# Patient Record
Sex: Female | Born: 1960 | Race: White | Hispanic: No | Marital: Married | Smoking: Never smoker
Health system: Southern US, Community
[De-identification: ages and names within clinical notes are randomized; demographics above are authoritative.]

## PROBLEM LIST (undated history)

## (undated) DIAGNOSIS — E039 Hypothyroidism, unspecified: Secondary | ICD-10-CM

## (undated) DIAGNOSIS — F419 Anxiety disorder, unspecified: Secondary | ICD-10-CM

## (undated) HISTORY — DX: Anxiety disorder, unspecified: F41.9

## (undated) HISTORY — DX: Hypothyroidism, unspecified: E03.9

---

## 2000-07-19 ENCOUNTER — Other Ambulatory Visit: Admission: RE | Admit: 2000-07-19 | Discharge: 2000-07-19 | Payer: Self-pay | Admitting: Obstetrics and Gynecology

## 2000-08-24 ENCOUNTER — Encounter (INDEPENDENT_AMBULATORY_CARE_PROVIDER_SITE_OTHER): Payer: Self-pay

## 2000-08-24 ENCOUNTER — Other Ambulatory Visit: Admission: RE | Admit: 2000-08-24 | Discharge: 2000-08-24 | Payer: Self-pay | Admitting: Obstetrics and Gynecology

## 2001-06-27 ENCOUNTER — Other Ambulatory Visit: Admission: RE | Admit: 2001-06-27 | Discharge: 2001-06-27 | Payer: Self-pay | Admitting: Obstetrics and Gynecology

## 2002-04-27 HISTORY — PX: ABDOMINAL HYSTERECTOMY: SHX81

## 2002-06-29 ENCOUNTER — Other Ambulatory Visit: Admission: RE | Admit: 2002-06-29 | Discharge: 2002-06-29 | Payer: Self-pay | Admitting: Obstetrics and Gynecology

## 2002-07-07 ENCOUNTER — Encounter: Admission: RE | Admit: 2002-07-07 | Discharge: 2002-07-07 | Payer: Self-pay | Admitting: Internal Medicine

## 2002-07-07 ENCOUNTER — Encounter: Payer: Self-pay | Admitting: Internal Medicine

## 2002-10-10 ENCOUNTER — Inpatient Hospital Stay (HOSPITAL_COMMUNITY): Admission: RE | Admit: 2002-10-10 | Discharge: 2002-10-12 | Payer: Self-pay | Admitting: Obstetrics and Gynecology

## 2002-10-10 ENCOUNTER — Encounter (INDEPENDENT_AMBULATORY_CARE_PROVIDER_SITE_OTHER): Payer: Self-pay | Admitting: *Deleted

## 2009-10-23 ENCOUNTER — Encounter: Admission: RE | Admit: 2009-10-23 | Discharge: 2009-10-23 | Payer: Self-pay | Admitting: Internal Medicine

## 2009-10-25 ENCOUNTER — Encounter: Admission: RE | Admit: 2009-10-25 | Discharge: 2009-10-25 | Payer: Self-pay | Admitting: Internal Medicine

## 2009-11-25 HISTORY — PX: CHOLECYSTECTOMY: SHX55

## 2009-12-11 ENCOUNTER — Encounter (INDEPENDENT_AMBULATORY_CARE_PROVIDER_SITE_OTHER): Payer: Self-pay | Admitting: *Deleted

## 2009-12-11 ENCOUNTER — Ambulatory Visit (HOSPITAL_COMMUNITY): Admission: RE | Admit: 2009-12-11 | Discharge: 2009-12-11 | Payer: Self-pay | Admitting: Gastroenterology

## 2009-12-20 ENCOUNTER — Encounter: Admission: RE | Admit: 2009-12-20 | Discharge: 2009-12-20 | Payer: Self-pay | Admitting: General Surgery

## 2010-01-30 ENCOUNTER — Encounter (INDEPENDENT_AMBULATORY_CARE_PROVIDER_SITE_OTHER): Payer: Self-pay | Admitting: *Deleted

## 2010-02-28 ENCOUNTER — Encounter (INDEPENDENT_AMBULATORY_CARE_PROVIDER_SITE_OTHER): Payer: Self-pay | Admitting: *Deleted

## 2010-02-28 ENCOUNTER — Ambulatory Visit: Payer: Self-pay | Admitting: Gastroenterology

## 2010-02-28 DIAGNOSIS — R109 Unspecified abdominal pain: Secondary | ICD-10-CM | POA: Insufficient documentation

## 2010-04-29 ENCOUNTER — Ambulatory Visit
Admission: RE | Admit: 2010-04-29 | Discharge: 2010-04-29 | Payer: Self-pay | Source: Home / Self Care | Attending: Gastroenterology | Admitting: Gastroenterology

## 2010-04-29 ENCOUNTER — Encounter: Payer: Self-pay | Admitting: Gastroenterology

## 2010-05-02 ENCOUNTER — Encounter: Payer: Self-pay | Admitting: Gastroenterology

## 2010-05-27 NOTE — Letter (Signed)
Summary: [facility] Instructions  [facility]  [address] [place], [place] [address]   Phone: [phone]  Fax: [phone]       Caroline Huynh    09-13-1960    MRN: [id number]        Procedure Day /Date:04/29/10 TUE     Arrival Time:1230 pm     Procedure Time:130 pm     Location of Procedure:                    X  [facility] (4th Floor)                        PREPARATION FOR COLONOSCOPY WITH MOVIPREP   Starting 5 days prior to your procedure 04/24/10  do not eat nuts, seeds, popcorn, corn, beans, peas,  salads, or any raw vegetables.  Do not take any fiber supplements (e.g. Metamucil, Citrucel, and Benefiber).  THE DAY BEFORE YOUR PROCEDURE         DATE: 04/28/10   DAY:MON  1.  Drink clear liquids the entire day-NO SOLID FOOD  2.  Do not drink anything colored red or purple.  Avoid juices with pulp.  No orange juice.  3.  Drink at least 64 oz. (8 glasses) of fluid/clear liquids during the day to prevent dehydration and help the prep work efficiently.  CLEAR LIQUIDS INCLUDE: Water Jello Ice Popsicles Tea (sugar ok, no milk/cream) Powdered fruit flavored drinks Coffee (sugar ok, no milk/cream) Gatorade Juice: apple, white grape, white cranberry  Lemonade Clear bullion, consomm, broth Carbonated beverages (any kind) Strained chicken noodle soup Hard Candy                             4.  In the morning, mix first dose of MoviPrep solution:    Empty 1 Pouch A and 1 Pouch B into the disposable container    Add lukewarm drinking water to the top line of the container. Mix to dissolve    Refrigerate (mixed solution should be used within 24 hrs)  5.  Begin drinking the prep at 5:00 p.m. The MoviPrep container is divided by 4 marks.   Every 15 minutes drink the solution down to the next mark (approximately 8 oz) until the full liter is complete.   6.  Follow completed prep with 16 oz of clear liquid of your choice (Nothing red or purple).   Continue to drink clear liquids until bedtime.  7.  Before going to bed, mix second dose of MoviPrep solution:    Empty 1 Pouch A and 1 Pouch B into the disposable container    Add lukewarm drinking water to the top line of the container. Mix to dissolve    Refrigerate  THE DAY OF YOUR PROCEDURE      DATE: 04/29/10 DAY: TUE  Beginning at 830 a.m. (5 hours before procedure):         1. Every 15 minutes, drink the solution down to the next mark (approx 8 oz) until the full liter is complete.  2. Follow completed prep with 16 oz. of clear liquid of your choice.    3. You may drink clear liquids until 1130 am (2 HOURS BEFORE PROCEDURE).   MEDICATION INSTRUCTIONS  Unless otherwise instructed, you should take regular prescription medications with a small sip of water   as early as possible the morning of your procedure.  OTHER INSTRUCTIONS  You will need a responsible adult at least 51 years of age to accompany you and drive you home.   This person must remain in the waiting room during your procedure.  Wear loose fitting clothing that is easily removed.  Leave jewelry and other valuables at home.  However, you may wish to bring a book to read or  an iPod/MP3 player to listen to music as you wait for your procedure to start.  Remove all body piercing jewelry and leave at home.  Total time from sign-in until discharge is approximately 2-3 hours.  You should go home directly after your procedure and rest.  You can resume normal activities the  day after your procedure.  The day of your procedure you should not:   Drive   Make legal decisions   Operate machinery   Drink alcohol   Return to work  You will receive specific instructions about eating, activities and medications before you leave.    The above instructions have been reviewed and explained to me by   _______________________    I fully understand and can verbalize these instructions  _____________________________ Date _________

## 2010-05-27 NOTE — Letter (Signed)
Summary: New Patient letter  [facility]  [address] [place], [place] [address]   Phone: [phone]  Fax: [phone]       01/30/2010 MRN: [id number]  Caroline Huynh [address] [place], [place]  [address]  Dear Ms. Caroline Huynh,  Welcome to the Gastroenterology Division at [facility].    You are scheduled to see Dr. Christella Hartigan on 02/28/10 at 10:15 am on the 3rd floor at [facility], [address]  We ask that you try to arrive at our office 15 minutes prior to your appointment time to allow for check-in.  We would like you to complete the enclosed self-administered evaluation form prior to your visit and bring it with you on the day of your appointment.  We will review it with you.  Also, please bring a complete list of all your medications or, if you prefer, bring the medication bottles and we will list them.  Please bring your insurance card so that we may make a copy of it.  If your insurance requires a referral to see a specialist, please bring your referral form from your primary care physician.  Co-payments are due at the time of your visit and may be paid by cash, check or credit card.     Your office visit will consist of a consult with your physician (includes a physical exam), any laboratory testing he/she may order, scheduling of any necessary diagnostic testing (e.g. x-ray, ultrasound, CT-scan), and scheduling of a procedure (e.g. Endoscopy, Colonoscopy) if required.  Please allow enough time on your schedule to allow for any/all of these possibilities.    If you cannot keep your appointment, please call [phone] to cancel or reschedule prior to your appointment date.  This allows [place] the opportunity to schedule an appointment for another patient in need of care.  If you do not cancel or reschedule by 5 p.m. the business day prior to your appointment date, you will be charged a $50.00 late cancellation/no-show fee.    Thank you for choosing  [facility] for your medical needs.  We appreciate the opportunity to care for you.  Please visit [place] at our website  to learn more about our practice.                     Sincerely,                                                             The Gastroenterology Division

## 2010-05-27 NOTE — Assessment & Plan Note (Signed)
History of Present Illness Visit Type: Initial Consult Primary GI MD: Rob Bunting MD Primary Provider: Joylene Draft Avva,MD Requesting Provider: Joylene Draft Avva,MD Chief Complaint: Abdominal Pain History of Present Illness:       pleasant 50 year old womanwho had lap chole about 2 months ago for GB EF of 9%.  She was having intermittent (2-3 years) of left flank "tightness" every few months. Then she started having RUQ discomfort intermittne that radiates to her back.  She also has a third pain in epigastricum that is improved with PPI (dexilant). Stretching can cause symptoms to worsen.    Eating does not effect any of the symptoms.  She thinks there are times when a BM will cause some tightness in her left side shortly afterwards.   She has no classic GERD symptoms.  Overall stable weight.  No overt GI bleeding.    She has been seing Dr. Ewing Schlein.  Not happy with him, wanted  second opinion.  She had a colonoscopy 4 years ago (poor prep). This showed internal and external hemorrhoids but was otherwise normal, stool was found in the entire colon and his "interfered with visualization"  Cholecystectomy did not improve any of her symptoms at all.  She has alternatiing constipation/loose stools.  Tried miralax for many years, changed to benefiber powder...not much improvement.  in the past few months she has had a variety of imaging tests. A CT scan done in July 2011 showed diverticulosis without diverticulitis. It was otherwise essentially normal. An abdominal ultrasound June 2011 was completely normal. A HIDA scan was document above with a low gallbladder ejection fraction. She has never had upper endoscopy.  basic set of labs including a CBC, complete metabolic profile were all normal recently.           Current Medications (verified): 1)  Benefiber  Powd (Wheat Dextrin) .... 2 Teaspoons Daily 2)  Vitamin D (Ergocalciferol) 50000 Unit Caps (Ergocalciferol) .... Weekly 3)   Synthroid 75 Mcg Tabs (Levothyroxine Sodium) .Marland Kitchen.. 1 By Mouth Once Daily 4)  Xyzal 5 Mg Tabs (Levocetirizine Dihydrochloride) .Marland Kitchen.. 1 By Mouth Once Daily 5)  Dexilant 60 Mg Cpdr (Dexlansoprazole) .... As Needed 6)  Estring 2 Mg Ring (Estradiol) .... Every 3 Months 7)  Clonazepam 0.5 Mg Tbdp (Clonazepam) .... 1/2 By Mouth Once Daily As Needed 8)  Hyoscyamine Sulfate Cr 0.375 Mg Xr12h-Tab (Hyoscyamine Sulfate) .Marland Kitchen.. 1 By Mouth Once Daily 9)  Vivelle-Dot 0.05 Mg/24hr Pttw (Estradiol) .... 2 Times Per Week As Needed 10)  Vitamin C 500 Mg Tabs (Ascorbic Acid) .Marland Kitchen.. 1 By Mouth Once Daily 11)  Centrum Silver  Tabs (Multiple Vitamins-Minerals) .Marland Kitchen.. 1 By Mouth Once Daily 12)  Triple Flex 500-400-125 Mg Tabs (Glucosamine-Chondroitin-Msm) .... Once Daily 13)  Fish Oil  Oil (Fish Oil) .Marland Kitchen.. 1200 Mg Once Daily  Allergies (verified): No Known Drug Allergies  Past History:  Past Medical History: Hyperlipidemia Vit D def GERD Hypothyroidism      Past Surgical History: laparoscopic cholecystectomy August 2011  Family History: heart disease No colon cancer  Social History: she is married, she has one son, she works as a [employer] Services operational officer, she does not smoke cigarettes or drink alcohol.  Review of Systems       Pertinent positive and negative review of systems were noted in the above HPI and GI specific review of systems.  All other review of systems was otherwise negative.   Vital Signs:  Patient profile:   50 year old female Height:      27  inches Weight:      157 pounds BMI:     26.22 BSA:     1.79 Pulse rate:   76 / minute Pulse rhythm:   regular BP sitting:   104 / 80  (left arm)  Vitals Entered By: Merri Ray CMA Duncan Dull) (February 28, 2010 10:05 AM)  Physical Exam  Additional Exam:  Constitutional: generally well appearing Psychiatric: alert and oriented times 3 Eyes: extraocular movements intact Mouth: oropharynx moist, no lesions Neck: supple, no lymphadenopathy Cardiovascular:  heart regular rate and rythm Lungs: CTA bilaterally Abdomen: soft, non-tender, non-distended, no obvious ascites, no peritoneal signs, normal bowel sounds Extremities: no lower extremity edema bilaterally Skin: no lesions on visible extremities    Impression & Recommendations:  Problem # 1:  multiple sites of abdominal discomfort the left flank pain seems unlikely to be GI related givin its location and the fact that body positions can make it better or worse. This seems more likely musculoskeletal. Her to upper abdominal pains are of unclear etiology, possibly functional dyspepsia. She had a poorly prepped colonoscopy 4 years ago and I think we should repeat that now given the poor prep and her symptoms. We should also at the same time proceed with an EGD. She did tell me today that some benzodiazepines that her gynecologist has prescribed for have been helping some of her abdominal discomforts and she is beginning to think that anxiety, stress may be related to her symptoms.  Patient Instructions: 1)  You will be scheduled to have a colonoscopy. 2)  You will be scheduled to have an upper endoscopy. 3)  A copy of this information will be sent to Dr. Felipa Eth. 4)  The medication list was reviewed and reconciled.  All changed / newly prescribed medications were explained.  A complete medication list was provided to the patient / caregiver.  Appended Document: Orders Update/movi    Clinical Lists Changes  Problems: Added new problem of ABDOMINAL PAIN OTHER SPECIFIED SITE (ICD-789.09) Medications: Added new medication of MOVIPREP 100 GM  SOLR (PEG-KCL-NACL-NASULF-NA ASC-C) As per prep instructions. - Signed Rx of MOVIPREP 100 GM  SOLR (PEG-KCL-NACL-NASULF-NA ASC-C) As per prep instructions.;  #1 x 0;  Signed;  Entered by: Chales Abrahams CMA (AAMA);  Authorized by: Rachael Fee MD;  Method used: Electronically to [facility] [address] # [id number]*, [address] [place], [place],  [place]  [address], Ph: [phone] or [phone], Fax: [phone] Orders: Added new Test order of Colon/Endo (Colon/Endo) - Signed    Prescriptions: MOVIPREP 100 GM  SOLR (PEG-KCL-NACL-NASULF-NA ASC-C) As per prep instructions.  #1 x 0   Entered by:   Chales Abrahams CMA (AAMA)   Authorized by:   Rachael Fee MD   Signed by:   Chales Abrahams CMA (AAMA) on 02/28/2010   Method used:   Electronically to        [facility] [address] [id number]* (retail)       [address]       [place], [place]  [address]       Ph: [phone] or [phone]       Fax: [phone]   RxID:   [id number]

## 2010-05-29 NOTE — Procedures (Signed)
Summary: Colonoscopy  Patient: Caroline Huynh Note: All result statuses are Final unless otherwise noted.  Tests: (1) Colonoscopy (COL)   COL Colonoscopy           DONE     [facility]     [address]     , [place]  [address]           COLONOSCOPY PROCEDURE REPORT           PATIENT:  [name], [name]  MR#:  [id number]     BIRTHDATE:  27-Oct-1960, 49 yrs. old  GENDER:  female     ENDOSCOPIST:  Rachael Fee, MD     REF. BY:  Chilton Greathouse, M.D.     PROCEDURE DATE:  04/29/2010     PROCEDURE:  Diagnostic Colonoscopy     ASA CLASS:  Class II     INDICATIONS:  left sided abd pains, colonscopy 4 years ago with     Dr. Ewing Schlein, poor prep     MEDICATIONS:   Fentanyl 75 mcg IV, Versed 8 mg IV           DESCRIPTION OF PROCEDURE:   After the risks benefits and     alternatives of the procedure were thoroughly explained, informed     consent was obtained.  Digital rectal exam was performed and     revealed no abnormalities.   The LB [id number] endoscope     was introduced through the anus and advanced to the cecum, which     was identified by both the appendix and ileocecal valve, without     limitations.  The quality of the prep was good, using MoviPrep.     The instrument was then slowly withdrawn as the colon was fully     examined.     <<PROCEDUREIMAGES>>     FINDINGS:  A normal appearing cecum, ileocecal valve, and     appendiceal orifice were identified. The ascending, hepatic     flexure, transverse, splenic flexure, descending, sigmoid colon,     and rectum appeared unremarkable (see image1, image2, and image3).     Retroflexed views in the rectum revealed no abnormalities.    The     scope was then withdrawn from the patient and the procedure     completed.     COMPLICATIONS:  None           ENDOSCOPIC IMPRESSION:     1) Normal colon     2) No polyps or cancers           RECOMMENDATIONS:     1) You should continue to follow colorectal cancer  screening     guidelines for "routine risk" patients with a repeat colonoscopy     in 10 years. There is no need for FOBT (stool) testing for at     least 5 years.           REPEAT EXAM:  10 years           ______________________________     Rachael Fee, MD           n.     eSIGNED:   Rachael Fee at 04/29/2010 01:50 PM           Royden Purl, [id number]  Note: An exclamation mark (!) indicates a result that was not dispersed into the flowsheet. Document Creation Date: 04/29/2010 1:50 PM _______________________________________________________________________  (1) Order result status: Final Collection or observation date-time:  04/29/2010 13:47 Requested date-time:  Receipt date-time:  Reported date-time:  Referring Physician:   Ordering Physician: Rob Bunting [phone]) Specimen Source:  Source: Launa Grill Order Number: [id number] Lab site:   Appended Document: Colonoscopy    Clinical Lists Changes  Observations: Added new observation of COLONNXTDUE: 04/2020 (04/29/2010 14:44)

## 2010-05-29 NOTE — Procedures (Signed)
Summary: Upper Endoscopy  Patient: Caroline Huynh Note: All result statuses are Final unless otherwise noted.  Tests: (1) Upper Endoscopy (EGD)   EGD Upper Endoscopy       DONE     [facility]     [address]     [place], [place]  [address]           ENDOSCOPY PROCEDURE REPORT           PATIENT:  [name], [name]  MR#:  [id number]     BIRTHDATE:  1960-12-22, 49 yrs. old  GENDER:  female     ENDOSCOPIST:  Rachael Fee, MD     PROCEDURE DATE:  04/29/2010     PROCEDURE:  EGD with biopsy, [address]     ASA CLASS:  Class II     INDICATIONS:  abdominal pain     MEDICATIONS:  There was residual sedation effect present from     prior procedure., Fentanyl 25 mcg IV, Versed 2 mg IV     TOPICAL ANESTHETIC:  Exactacain Spray           DESCRIPTION OF PROCEDURE:   After the risks benefits and     alternatives of the procedure were thoroughly explained, informed     consent was obtained.  The LB [id number] endoscope was     introduced through the mouth and advanced to the second portion of     the duodenum, without limitations.  The instrument was slowly     withdrawn as the mucosa was fully examined.     <<PROCEDUREIMAGES>>     There was mild, non-specific gastritis in distal stomach. Biopsies     were taken to check for H. pylori (see image3).  Otherwise the     examination was normal (see image2, image4, image5, and image6).     Retroflexed views revealed no abnormalities.    The scope was then     withdrawn from the patient and the procedure completed.     COMPLICATIONS:  None           ENDOSCOPIC IMPRESSION:     1) Mild gastritis     2) Otherwise normal examination           RECOMMENDATIONS:     Await biopsy results.  Appropriate antibiotics will be started     if the biopsies show H. pylori.     Her symptoms have improved with anxiolitics started by her     gynecologist. It is not clear the the pains are GI related.           ______________________________  Rachael Fee, MD           cc: Larina Earthly, MD           n.     Rosalie Doctor:   Rachael Fee at 04/29/2010 01:59 PM           Royden Purl, [id number]  Note: An exclamation mark (!) indicates a result that was not dispersed into the flowsheet. Document Creation Date: 04/29/2010 1:59 PM _______________________________________________________________________  (1) Order result status: Final Collection or observation date-time: 04/29/2010 13:54 Requested date-time:  Receipt date-time:  Reported date-time:  Referring Physician:   Ordering Physician: Rob Bunting [phone]) Specimen Source:  Source: Launa Grill Order Number: [id number] Lab site:

## 2010-05-29 NOTE — Letter (Signed)
Summary: Results Letter  [facility]  [address] [place], [place] [address]   Phone: [phone]  Fax: [phone]        May 02, 2010 MRN: [id number]    RAMSIE OSTRANDER [address] [place], [place]  [address]    Dear Ms. Amison,   The biopsies taken during your recent upper endoscopy showed no sign of infection or cancer.  You should continue to follow the recommendations that we discussed at the time of your procedure.  Please feel free to call if you have any further questions or concerns.     Sincerely,  Rachael Fee MD  This letter has been electronically signed by your physician.  Appended Document: Results Letter Letter mailed

## 2010-09-12 NOTE — Op Note (Signed)
NAME:  Caroline Huynh, Caroline Huynh                         ACCOUNT NO.:  [id number]   MEDICAL RECORD NO.:  [id number]                   PATIENT TYPE:  INP   LOCATION:  9309                                 FACILITY:  [facility]   PHYSICIAN:  Laqueta Linden, M.D.                 DATE OF BIRTH:  [birth year]   DATE OF PROCEDURE:  DATE OF DISCHARGE:  10/10/2002                                 OPERATIVE REPORT   PREOPERATIVE DIAGNOSIS:  Leiomyomata uteri.   POSTOPERATIVE DIAGNOSES:  1. Leiomyomata uteri.  2. Tubal/peritoneal endometriosis.   PROCEDURE:  Total abdominal hysterectomy with distal left salpingectomy,  excision of endometriosis implants, placement of on-Q system x2.   SURGEON:  Laqueta Linden, M.D.   ASSISTANT:  Edwena Felty. Romine, M.D.   ANESTHESIA:  General endotracheal anesthesia.   ESTIMATED BLOOD LOSS:  125 mL.   URINE OUTPUT:  100 mL.   FLUIDS REPLACED:  Two liters of Crystalloid.   COUNTS:  Correct x2.   COMPLICATIONS:  None.   INDICATIONS FOR PROCEDURE:  Caroline Huynh is a 50 year old gravida 1, para  1, married white female with enlarging increasingly symptomatic fibroids  with dyspareunia, increasing pelvic pressure, and urinary retention  symptoms.  Pelvic ultrasound confirms an enlarged uterus with greater than  eight fibroids, the largest measuring over 5 cm in diameter.  Of note, the  patient has had one prior C-section, has a very small pelvis and no  cervicouterine descent and therefore is to undergo total abdominal  hysterectomy.  Ovaries will be maintained due to her young age.  She has  stopped her oral contraceptives six weeks prior to surgery.  She has been  extensively counseled as to the risks, benefits, alternatives, and  complications of the procedure including, but not limited to, anesthesia  risks, infection, bleeding requiring transfusion; injury to bowel, bladder,  ureters, vessels or nerves; possibility of fistula formation, slightly  increased due to  her prior cesarean section; as well as other risks  including DVT, PE, pneumonia, death and other unknown risks.  She  understands that she will be permanently and irreversibly sterilized as a  result.  She accepts ovarian removal if findings at surgery necessitate.  She has received Ancef 1 g IV antibiotic prophylaxis preoperatively.  She  and her husband have both seen the film and have given full consent, had  their questions answered and agree to proceed.   DESCRIPTION OF PROCEDURE:  The patient was taken to the operating room and  after proper identification and consents were ascertained, she was placed on  the operating table in the supine position.  After the induction of general  endotracheal anesthesia, she was placed in the frog legged position and the  abdomen, perineum and vagina were prepped and draped in routine sterile  fashion.  A transurethral Foley was placed.  She was returned to the supine  position.  A  transverse incision was made through the patient's prior  incision and carried down to the level of the anterior rectus fascia.  The  fascia was incised and the incision extended laterally, superiorly and  inferiorly.  The rectus muscles were separated and the parietal peritoneum  elevated and incised. The incision was extended superiorly and inferiorly to  the level of the bladder.  Palpation of the upper abdomen revealed smooth  renal contours bilaterally.  Appendix was not visualized.  The uterus was  retroflexed into the cul-de-sac with multiple fibroids but was free of  adhesions. There were several endometriosis implants, most notably with a  large nodule in the mid section of the left fallopian tube as well as a  smaller nodule toward the fimbriated end.  There was also a uterosacral  nodule on the left uterosacral ligament as well as a stellate appearing  implant in the peritoneum on the left aspect of the cul-de-sac overlying the  ureter.  Both ovaries were  noted to be quite small, consistent with  suppression from long-term oral contraceptive use but were otherwise within  normal limits.  There was a large lower uterine segment fibroid with the  bladder adherent above this fibroid.  The self-retaining retractor was  placed and the bowel packed in the upper abdomen using moistened lap packs.  The uterus was elevated into the incision and round ligaments were clamped,  cut, and suture ligated with 0 Vicryl.  Dissection was carried forward in  the anterior leaf of the broad ligament with careful sharp dissection of the  bladder which was adherent over the top of the fibroid and to the lower  uterine segment.  This was noted to be quite vascular, felt related to her  prior surgery as well as the proximity of the fibroid vasculature.  The  bladder flap was advanced.  A window was then made in the posterior broad  ligament and curved Heaney clamps placed across the proximal adnexal  pedicles bilaterally with pedicles cut and doubly ligated with free tie and  stitch of 0 Vicryl.  The distal left fallopian tube was excised with the  previously mentioned nodules and sent to pathology.  At this point, the  uterine vessels were skeletonized and curved Heaney clamps placed with  pedicles cut and suture ligated with 0 Vicryl.  The uterosacral nodule was  excised and sent to pathology.  The uterine vessels were then doubly ligated  with a small additional pedicle of cardinal ligament.  As the bladder was  further advanced, the cardinal ligaments were clamped, cut, and suture  ligated on both sides.  The upper vaginal angles were then clamped with  pedicles cut, Heaney ligated and tagged.  The Satinsky scissors were then  used to circumscribe the cervix with removal of the intact specimen which  was sent for final sectioning to pathology.  Richardson angle sutures were placed bilaterally.  The remainder of the vaginal cuff was closed with  interrupted  figure-of-eight sutures of 0 Vicryl.  Several small bleeding  points were cauterized.  The right ureter was visualized, was of normal  course and caliber and peristalsing normally at the conclusion of the  procedure.  The left ureter was somewhat more difficulty to visualize deep  in the pelvis due to the overlying adherence due to the overlying thickening  of the tissue to the endometriosis implant.  However, it appeared to be of  normal caliber proximal to this and peristalsis was visualized as well.  Both adnexal pedicles were suspended to the ipsilateral round ligament.  Copious lavage was accomplished.  Several small bleeding points were  cauterized or ligated.  The uterosacral ligaments were plicated posteriorly  to decrease the likelihood of enterocele formation.  All instruments and the  retractor were then removed.  The parietal peritoneum was then closed in a  running fashion using 2-0 Vicryl suture.  Counts were correct prior to  closure of the abdomen.  The rectus muscles were loosely reapproximated in  the midline.  Subfascial hemostasis was ascertained.  A subfascial on-Q  catheter was then placed from the left mid quadrant.  The fascia was then  closed over the top of this catheter using two sutures of  0 Maxon from both  lateral aspects to the midline.  After subcutaneous hemostasis was  ascertained, a subcutaneous on-Q catheter was placed through the right mid  quadrant and the incision was then closed in a running stitch using a Keith  needle with 4-0 Vicryl.  The catheters were steri-stripped in place and then  covered with OpSite.  The subfascial catheter was flushed with 10 mL of 1%  plain lidocaine as a loading dose. The subcutaneous catheter was injected  with 5 mL of 1% plain lidocaine as a loading dose.  Both were attached to  the pump with 0.5% plain Marcaine at a rate of 2 mL per hour per catheter.  After the skin incision was closed, the quarter inch Steri-Strips  were then  placed along the  incision.  Pressure dressing was applied.  The AccuPump was attached.  The  patient was stable and extubated on transfer to the recovery room.  Estimated blood loss was 125 mL.  Urine output was 100 mL.  Fluids were 2  liters of Crystalloid.  There were no complications.                                               Laqueta Linden, M.D.    LKS/MEDQ  D:  10/10/2002  T:  10/10/2002  Job:  [id number]   cc:   Larina Earthly, M.D.  [address]  [place]  [place] [address]  Fax: [phone]

## 2011-01-11 IMAGING — CR DG CHEST 2V
2 series · 2 of 2 positions shown · non-contrast
Comparison: None

CLINICAL DATA: Biliary dyskinesia.  Preop respiratory exam.

CHEST - 2 VIEW

[w chest pa]
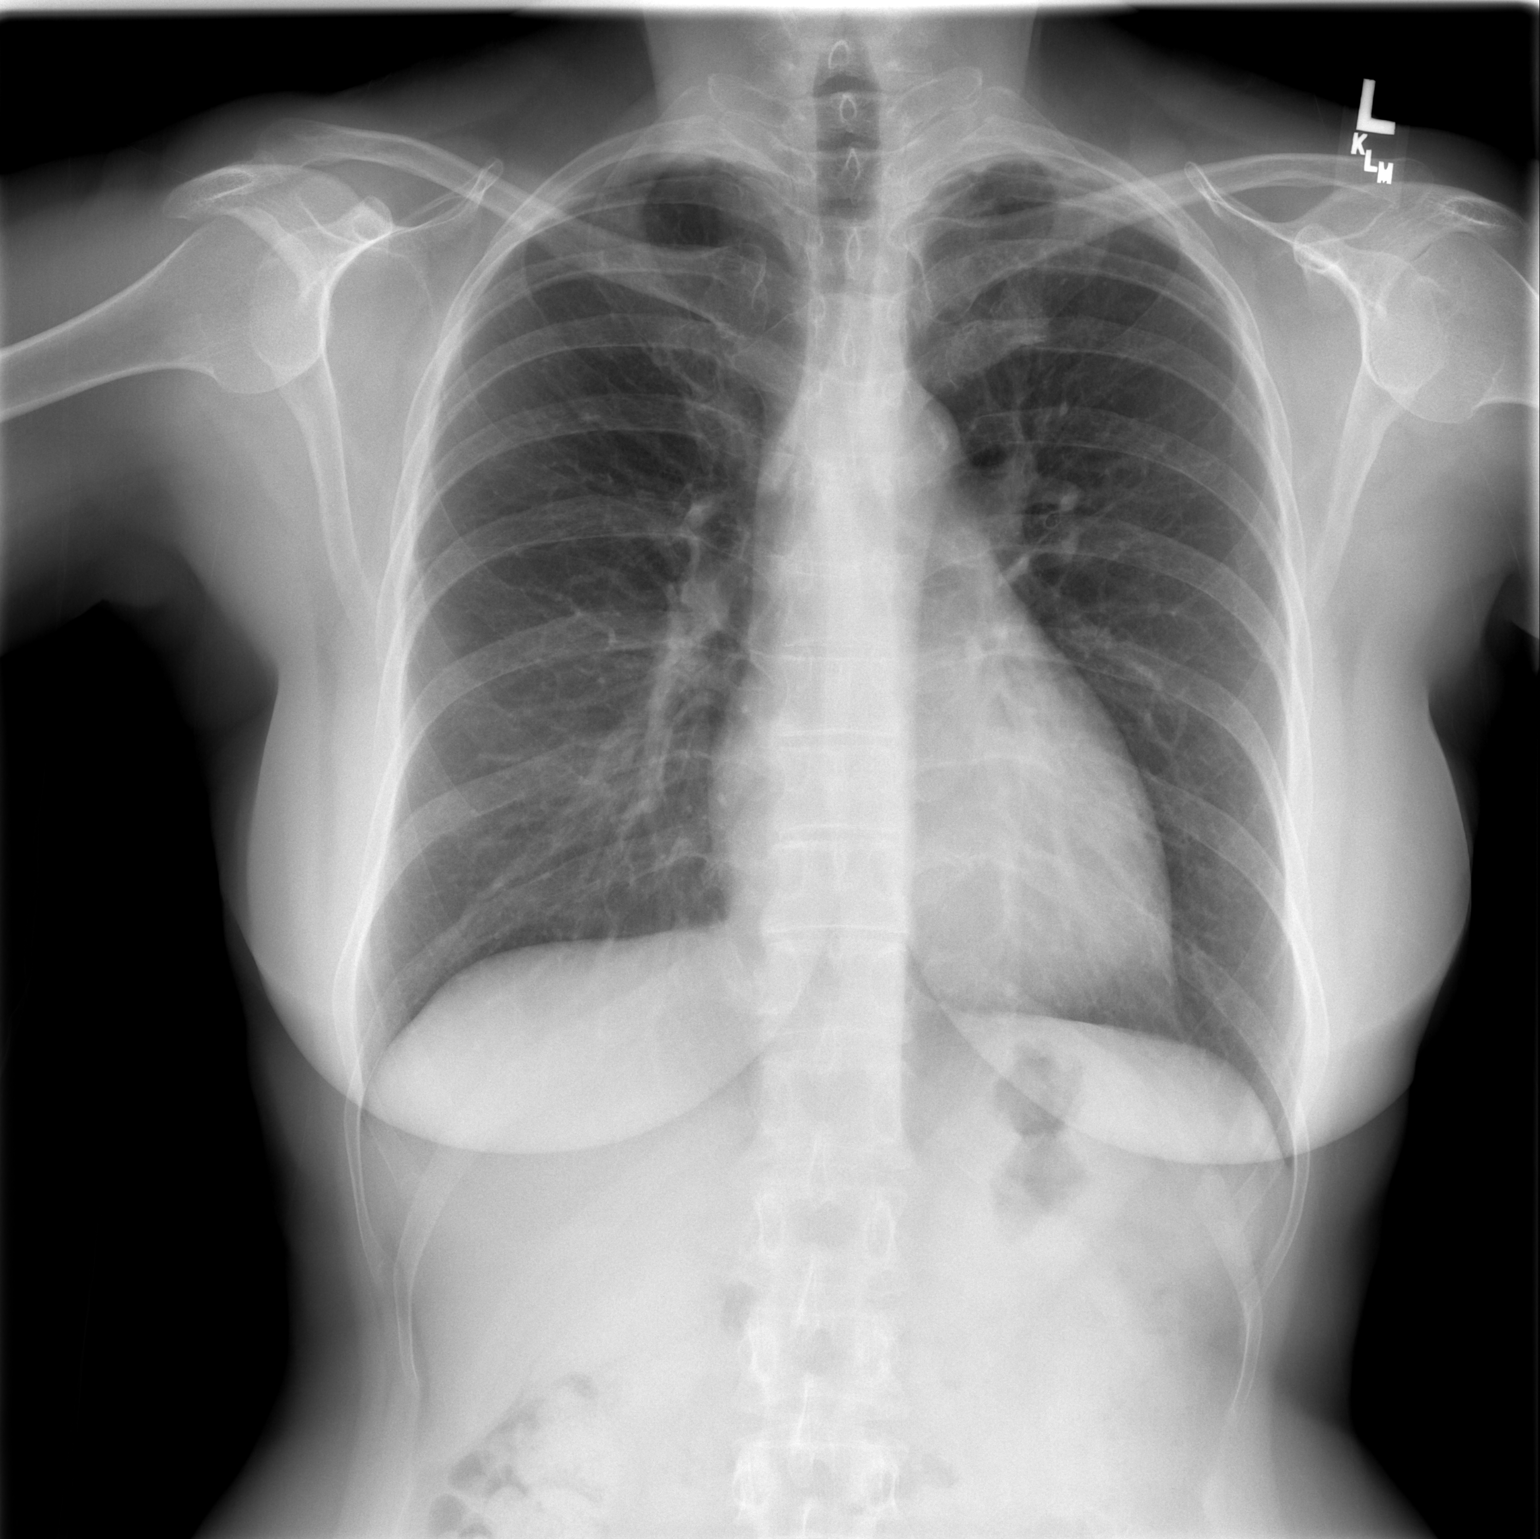

[w chest lat]
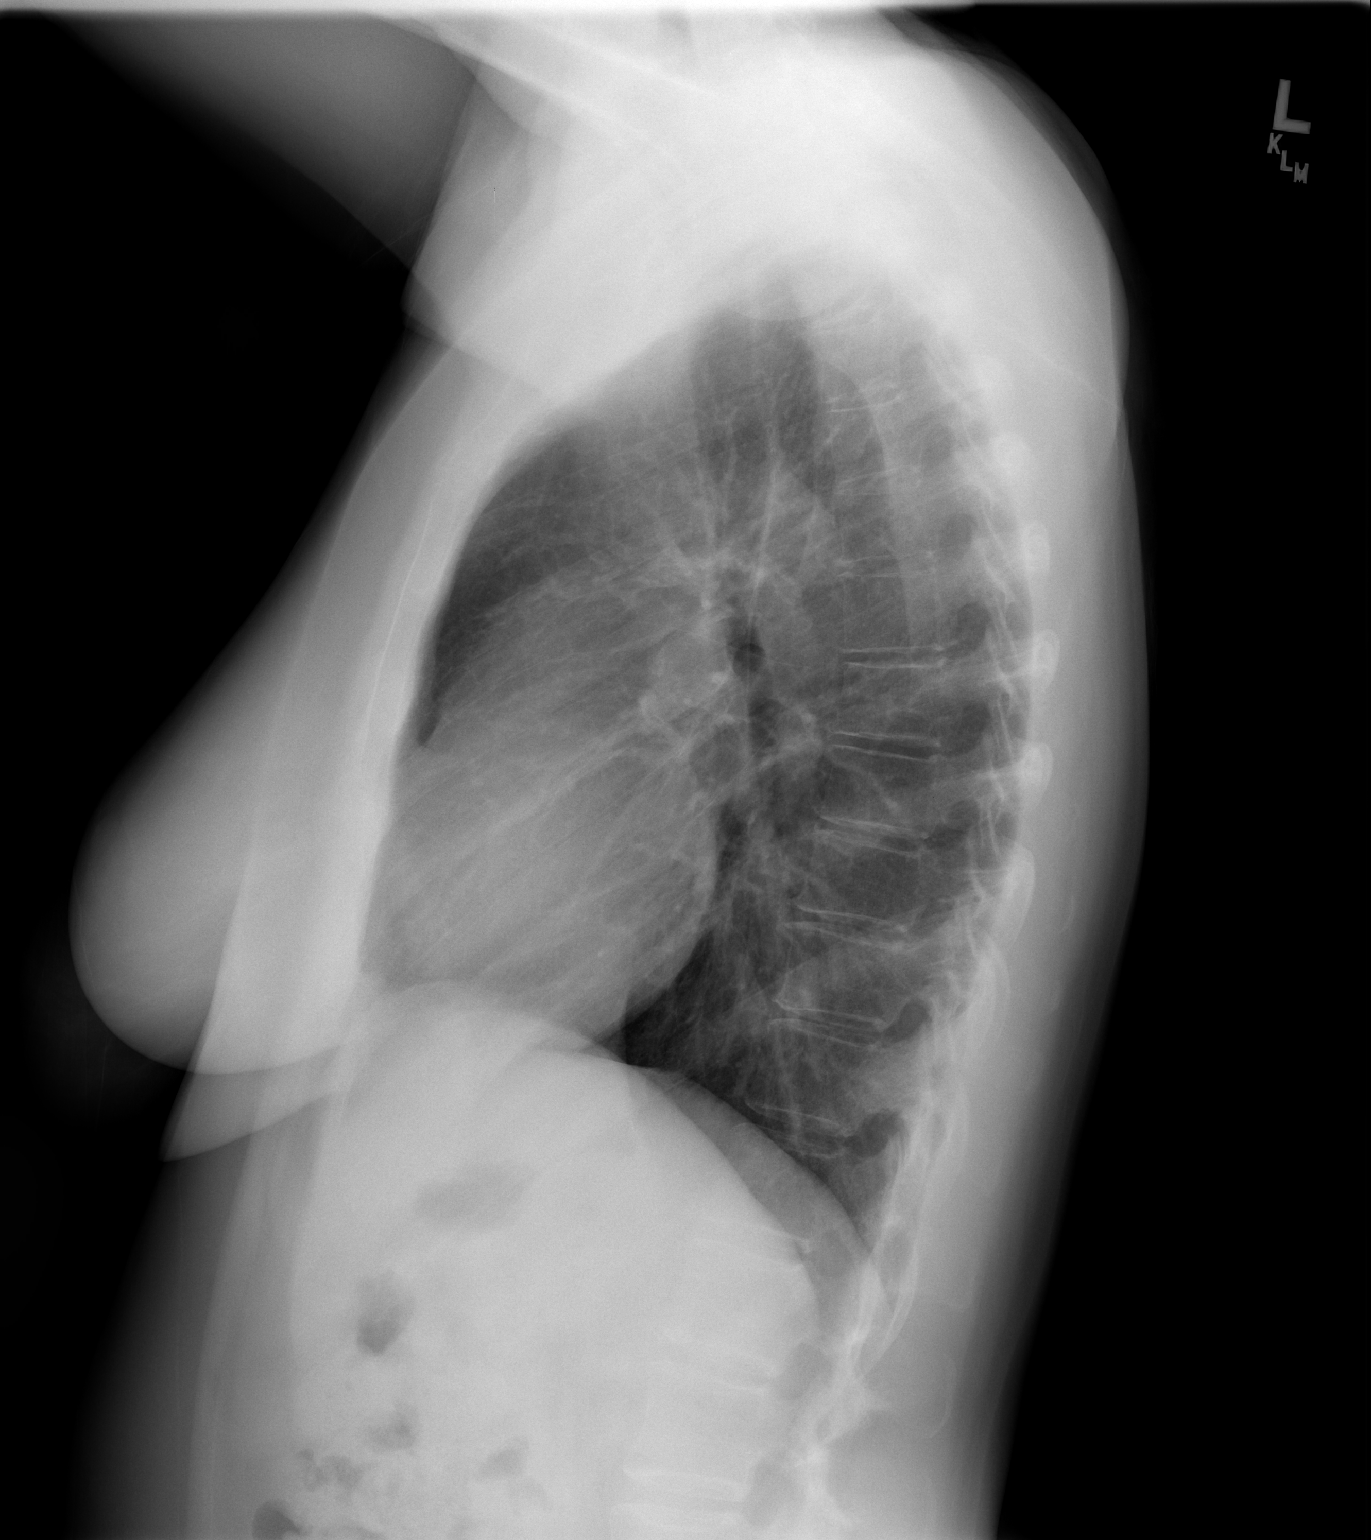

[2 of 2 positions shown; findings below may reference images not displayed]

FINDINGS: Heart size is normal and the vascularity is normal.
There is no infiltrate or edema.  No mass lesion is present.  Mild
apical scarring is present bilaterally.
IMPRESSION: No acute cardiopulmonary disease.

## 2013-04-10 ENCOUNTER — Encounter: Payer: Self-pay | Admitting: Certified Nurse Midwife

## 2013-04-12 ENCOUNTER — Ambulatory Visit (INDEPENDENT_AMBULATORY_CARE_PROVIDER_SITE_OTHER): Payer: PRIVATE HEALTH INSURANCE | Admitting: Certified Nurse Midwife

## 2013-04-12 ENCOUNTER — Encounter: Payer: Self-pay | Admitting: Certified Nurse Midwife

## 2013-04-12 VITALS — BP 110/62 | HR 66 | Resp 16 | Ht 65.75 in | Wt 175.0 lb

## 2013-04-12 DIAGNOSIS — N952 Postmenopausal atrophic vaginitis: Secondary | ICD-10-CM

## 2013-04-12 DIAGNOSIS — Z01419 Encounter for gynecological examination (general) (routine) without abnormal findings: Secondary | ICD-10-CM

## 2013-04-12 MED ORDER — ESTRADIOL 2 MG VA RING: 2.0000 mg | VAGINAL_RING | VAGINAL | Status: DC

## 2013-04-12 NOTE — Progress Notes (Signed)
52 y.o. G39P1001 Married Caucasian Fe here for annual exam.  Menopausal no HRT. Denies vaginal bleeding. Using Estring for atrophic vaginitis, working well no issues. Recent injury to right ankle with slight fracture, under follow up. Spouse driving her everywhere ! Sees PCP for aex ,labs and Hypothyroid management. No change in medication. No other health issues today.  Patient's last menstrual period was 04/27/2002.          Sexually active: no  The current method of family planning is status post hysterectomy.    Exercising: no  exercise Smoker:  no  Health Maintenance: Pap:  06-29-02 neg MMG: 1/ 2014 Colonoscopy:  2011 BMD:   2011 TDaP:  2010 Labs: none Self breast exam: done occ   reports that she has never smoked. She does not have any smokeless tobacco history on file. She reports that she does not drink alcohol or use illicit drugs.  Past Medical History  Diagnosis Date  . Hypothyroid   . Anxiety     Past Surgical History  Procedure Laterality Date  . Cholecystectomy  8/11  . Abdominal hysterectomy  2004     TAH ovaries retained  . Cesarean section      Current Outpatient Prescriptions  Medication Sig Dispense Refill  . Ascorbic Acid (VITAMIN C PO) Take by mouth daily.      . Calcium Carbonate-Vitamin D (CALCIUM + D PO) Take by mouth daily.      Marland Kitchen dexlansoprazole (DEXILANT) 60 MG capsule Take 60 mg by mouth daily.      Marland Kitchen escitalopram (LEXAPRO) 10 MG tablet Take 10 mg by mouth daily.      Marland Kitchen estradiol (ESTRING) 2 MG vaginal ring Place 2 mg vaginally every 3 (three) months. follow package directions  1 each  4  . Glucosamine-Chondroitin-MSM (TRIPLE FLEX PO) Take by mouth daily.      Marland Kitchen levocetirizine (XYZAL) 5 MG tablet Take 5 mg by mouth as needed.       Marland Kitchen levothyroxine (SYNTHROID, LEVOTHROID) 75 MCG tablet Take 75 mcg by mouth daily before breakfast.      . Multiple Vitamins-Minerals (MULTIVITAMIN PO) Take by mouth daily.      . Omega-3 Fatty Acids (FISH OIL PO) Take by  mouth daily.       No current facility-administered medications for this visit.    Family History  Problem Relation Age of Onset  . Thyroid disease Mother   . Heart attack Mother   . Hypertension Mother   . Thyroid disease Sister   . Other Maternal Aunt     blood clotting issues  . Heart attack Maternal Grandfather   . Hypertension Maternal Grandfather     ROS:  Pertinent items are noted in HPI.  Otherwise, a comprehensive ROS was negative.  Exam:   BP 110/62  Pulse 66  Resp 16  Ht 5' 5.75" (1.67 m)  Wt 175 lb (79.379 kg)  BMI 28.46 kg/m2  LMP 04/27/2002 Height: 5' 5.75" (167 cm)  Ht Readings from Last 3 Encounters:  04/12/13 5' 5.75" (1.67 m)  02/28/10 5\' 5"  (1.651 m)    General appearance: alert, cooperative and appears stated age Head: Normocephalic, without obvious abnormality, atraumatic Neck: no adenopathy, supple, symmetrical, trachea midline and thyroid normal to inspection and palpation and non-palpable Lungs: clear to auscultation bilaterally Breasts: normal appearance, no masses or tenderness, No nipple retraction or dimpling, No nipple discharge or bleeding, No axillary or supraclavicular adenopathy Heart: regular rate and rhythm Abdomen: soft, non-tender; no  masses,  no organomegaly Extremities: extremities normal, atraumatic, no cyanosis or edema Skin: Skin color, texture, turgor normal. No rashes or lesions Lymph nodes: Cervical, supraclavicular, and axillary nodes normal. No abnormal inguinal nodes palpated Neurologic: Grossly normal   Pelvic: External genitalia:  no lesions              Urethra:  normal appearing urethra with no masses, tenderness or lesions              Bartholin's and Skene's: normal                 Vagina:atrophic appearing vagina with normal color and discharge, no lesions Estring present              Cervix: absent              Pap taken: no Bimanual Exam:  Uterus:  uterus absent              Adnexa: normal adnexa and no  mass, fullness, tenderness               Rectovaginal: Confirms               Anus:  normal sphincter tone, no lesions  A:  Well Woman with normal exam  Menopausal no HRT  Atrophic vaginitis uses Estring with good results  Recent  Right ankle injury with fall under follow up  P:   Reviewed health and wellness pertinent to exam  Desires continuation of Estring, Rx Estring see order  Discussed BMD due, patient has done with PCP.  Pap smear as per guidelines   Mammogram yearly pap smear not taken today  counseled on breast self exam, mammography screening, feminine hygiene, osteoporosis, adequate intake of calcium and vitamin D, diet and exercise  return annually or prn  An After Visit Summary was printed and given to the patient.

## 2013-04-12 NOTE — Patient Instructions (Signed)

## 2013-04-13 ENCOUNTER — Telehealth: Payer: Self-pay | Admitting: Certified Nurse Midwife

## 2013-04-13 NOTE — Progress Notes (Signed)
Reviewed personally.  M. Suzanne Saleema Weppler, MD.  

## 2013-04-13 NOTE — Telephone Encounter (Signed)
Patient is saying that per her insurance and pharmacy that the prescription is sent for 1 moth supply when it needs to be for 3 months. Pharmacy saying we need to contact her insurance?? Two faxes came over for this carol has.

## 2013-04-18 NOTE — Telephone Encounter (Signed)
Spoke with pharmacy. rx was approved. They will contact patient.

## 2013-04-18 NOTE — Telephone Encounter (Signed)
Patient is calling about the  estradiol (ESTRING) 2 MG vaginal ring  Place 2 mg vaginally every 3 (three) months. follow package directions, Starting 04/12/2013, Until Discontinued, Normal, Last Dose: Not Recorded  Refills: 4 ordered Pharmacy: [id number] - [place], [place] - [phone]  Pharmacy is saying that insurance wont cover it due to the way it is written?? Said to call pharmacy and speak to [employer] [facility] [address] Speak to [place] [phone]

## 2014-02-26 ENCOUNTER — Encounter: Payer: Self-pay | Admitting: Certified Nurse Midwife

## 2014-05-02 ENCOUNTER — Ambulatory Visit: Payer: PRIVATE HEALTH INSURANCE | Admitting: Certified Nurse Midwife

## 2014-05-10 ENCOUNTER — Encounter: Payer: Self-pay | Admitting: Nurse Practitioner

## 2014-05-10 ENCOUNTER — Ambulatory Visit (INDEPENDENT_AMBULATORY_CARE_PROVIDER_SITE_OTHER): Payer: Commercial Managed Care - PPO | Admitting: Nurse Practitioner

## 2014-05-10 VITALS — BP 120/74 | HR 64 | Ht 65.75 in | Wt 163.0 lb

## 2014-05-10 DIAGNOSIS — Z01419 Encounter for gynecological examination (general) (routine) without abnormal findings: Secondary | ICD-10-CM

## 2014-05-10 MED ORDER — ESTRADIOL 2 MG VA RING: 2.0000 mg | VAGINAL_RING | VAGINAL | Status: DC

## 2014-05-10 NOTE — Patient Instructions (Signed)
EXERCISE AND DIET:  We recommended that you start or continue a regular exercise program for good health. Regular exercise means any activity that makes your heart beat faster and makes you sweat.  We recommend exercising at least 30 minutes per day at least 3 days a week, preferably 4 or 5.  We also recommend a diet low in fat and sugar.  Inactivity, poor dietary choices and obesity can cause diabetes, heart attack, stroke, and kidney damage, among others.    ALCOHOL AND SMOKING:  Women should limit their alcohol intake to no more than 7 drinks/beers/glasses of wine (combined, not each!) per week. Moderation of alcohol intake to this level decreases your risk of breast cancer and liver damage. And of course, no recreational drugs are part of a healthy lifestyle.  And absolutely no smoking or even second hand smoke. Most people know smoking can cause heart and lung diseases, but did you know it also contributes to weakening of your bones? Aging of your skin?  Yellowing of your teeth and nails?  CALCIUM AND VITAMIN D:  Adequate intake of calcium and Vitamin D are recommended.  The recommendations for exact amounts of these supplements seem to change often, but generally speaking 600 mg of calcium (either carbonate or citrate) and 800 units of Vitamin D per day seems prudent. Certain women may benefit from higher intake of Vitamin D.  If you are among these women, your doctor will have told you during your visit.    PAP SMEARS:  Pap smears, to check for cervical cancer or precancers,  have traditionally been done yearly, although recent scientific advances have shown that most women can have pap smears less often.  However, every woman still should have a physical exam from her gynecologist every year. It will include a breast check, inspection of the vulva and vagina to check for abnormal growths or skin changes, a visual exam of the cervix, and then an exam to evaluate the size and shape of the uterus and  ovaries.  And after 55 years of age, a rectal exam is indicated to check for rectal cancers. We will also provide age appropriate advice regarding health maintenance, like when you should have certain vaccines, screening for sexually transmitted diseases, bone density testing, colonoscopy, mammograms, etc.   MAMMOGRAMS:  All women over 40 years old should have a yearly mammogram. Many facilities now offer a "3D" mammogram, which may cost around $50 extra out of pocket. If possible,  we recommend you accept the option to have the 3D mammogram performed.  It both reduces the number of women who will be called back for extra views which then turn out to be normal, and it is better than the routine mammogram at detecting truly abnormal areas.    COLONOSCOPY:  Colonoscopy to screen for colon cancer is recommended for all women at age 50.  We know, you hate the idea of the prep.  We agree, BUT, having colon cancer and not knowing it is worse!!  Colon cancer so often starts as a polyp that can be seen and removed at colonscopy, which can quite literally save your life!  And if your first colonoscopy is normal and you have no family history of colon cancer, most women don't have to have it again for 10 years.  Once every ten years, you can do something that may end up saving your life, right?  We will be happy to help you get it scheduled when you are ready.    Be sure to check your insurance coverage so you understand how much it will cost.  It may be covered as a preventative service at no cost, but you should check your particular policy.     Atrophic Vaginitis Atrophic vaginitis is a problem of low levels of estrogen in women. This problem can happen at any age. It is most common in women who have gone through menopause ("the change").  HOW WILL I KNOW IF I HAVE THIS PROBLEM? You may have:  Trouble with peeing (urinating), such as:  Going to the bathroom often.  A hard time holding your pee until you reach  a bathroom.  Leaking pee.  Having pain when you pee.  Itching or a burning feeling.  Vaginal bleeding and spotting.  Pain during sex.  Dryness of the vagina.  A yellow, bad-smelling fluid (discharge) coming from the vagina. HOW WILL MY DOCTOR CHECK FOR THIS PROBLEM?  During your exam, your doctor will likely find the problem.  If there is a vaginal fluid, it may be checked for infection. HOW WILL THIS PROBLEM BE TREATED? Keep the vulvar skin as clean as possible. Moisturizers and lubricants can help with some of the symptoms. Estrogen replacement can help. There are 2 ways to take estrogen:  Systemic estrogen gets estrogen to your whole body. It takes many weeks or months before the symptoms get better.  You take an estrogen pill.  You use a skin patch. This is a patch that you put on your skin.  If you still have your uterus, your doctor may ask you to take a hormone. Talk to your doctor about the right medicine for you.  Estrogen cream.  This puts estrogen only at the part of your body where you apply it. The cream is put into the vagina or put on the vulvar skin. For some women, estrogen cream works faster than pills or the patch. CAN ALL WOMEN WITH THIS PROBLEM USE ESTROGEN? No. Women with certain types of cancer, liver problems, or problems with blood clots should not take estrogen. Your doctor can help you decide the best treatment for your symptoms. Document Released: 09/30/2007 Document Revised: 04/18/2013 Document Reviewed: 09/30/2007 [employer] Patient Information 2015 [employer]. This information is not intended to replace advice given to you by your health care provider. Make sure you discuss any questions you have with your health care provider.  

## 2014-05-10 NOTE — Progress Notes (Signed)
Patient ID: Caroline Huynh, female   DOB: [birth year], 54 y.o.   MRN: [id number] 54 y.o. [id number] Married  Caucasian Fe here for annual exam.  No new health problems.  Using Estring for atrophic vaginitis.  Patient's last menstrual period was 04/27/2002.          Sexually active: no  The current method of family planning is status post hysterectomy.  Exercising: walking about 2 miles (25 minutes) 2-3 times per week Smoker: no  Health Maintenance: Pap: 06-29-02 neg MMG: 04/2013, 3D normal per patient, Caroline Huynh has apt. 1/26 Colonoscopy: 2011 repeat 10 years BMD:  Scheduled in Feb 2016 at Dr. Vilinda FlakeAva's office TDaP: 12/2008 Labs:  01/2014 Dr. Virgel ManifoldAva   reports that she has never smoked. She does not have any smokeless tobacco history on file. She reports that she does not drink alcohol or use illicit drugs.  Past Medical History  Diagnosis Date  . Hypothyroid   . Anxiety     Past Surgical History  Procedure Laterality Date  . Cholecystectomy  8/11  . Abdominal hysterectomy  2004     TAH ovaries retained  . Cesarean section      Current Outpatient Prescriptions  Medication Sig Dispense Refill  . Ascorbic Acid (VITAMIN C PO) Take by mouth daily.    . Calcium Carbonate-Vitamin D (CALCIUM + D PO) Take by mouth daily.    Marland Kitchen. dexlansoprazole (DEXILANT) 60 MG capsule Take 60 mg by mouth daily.    Marland Kitchen. escitalopram (LEXAPRO) 10 MG tablet Take 10 mg by mouth daily.    Marland Kitchen. estradiol (ESTRING) 2 MG vaginal ring Place 2 mg vaginally every 3 (three) months. follow package directions 1 each 4  . Glucosamine-Chondroitin-MSM (TRIPLE FLEX PO) Take by mouth daily.    Marland Kitchen. levocetirizine (XYZAL) 5 MG tablet Take 5 mg by mouth as needed.     Marland Kitchen. levothyroxine (SYNTHROID, LEVOTHROID) 75 MCG tablet Take 75 mcg by mouth daily before breakfast.    . Multiple Vitamins-Minerals (MULTIVITAMIN PO) Take by mouth daily.    . Omega-3 Fatty Acids (FISH OIL PO) Take by mouth daily.     No current facility-administered  medications for this visit.    Family History  Problem Relation Age of Onset  . Thyroid disease Mother   . Heart attack Mother   . Hypertension Mother   . Thyroid disease Sister   . Other Maternal Aunt     blood clotting issues  . Heart attack Maternal Grandfather   . Hypertension Maternal Grandfather     ROS:  Pertinent items are noted in HPI.  Otherwise, a comprehensive ROS was negative.  Exam:   BP 120/74 mmHg  Pulse 64  Ht 5' 5.75" (1.67 m)  Wt 163 lb (73.936 kg)  BMI 26.51 kg/m2  LMP 04/27/2002 Height: 5' 5.75" (167 cm) Ht Readings from Last 3 Encounters:  05/10/14 5' 5.75" (1.67 m)  04/12/13 5' 5.75" (1.67 m)  02/28/10 5\' 5"  (1.651 m)    General appearance: alert, cooperative and appears stated age Head: Normocephalic, without obvious abnormality, atraumatic Neck: no adenopathy, supple, symmetrical, trachea midline and thyroid normal to inspection and palpation Lungs: clear to auscultation bilaterally Breasts: normal appearance, no masses or tenderness Heart: regular rate and rhythm Abdomen: soft, non-tender; no masses,  no organomegaly Extremities: extremities normal, atraumatic, no cyanosis or edema Skin: Skin color, texture, turgor normal. No rashes or lesions Lymph nodes: Cervical, supraclavicular, and axillary nodes normal. No abnormal inguinal nodes palpated Neurologic: Grossly normal  Pelvic: External genitalia:  no lesions              Urethra:  normal appearing urethra with no masses, tenderness or lesions              Bartholin's and Skene's: normal                 Vagina: normal appearing vagina with normal color and discharge, no lesions              Cervix: absent              Pap taken: No. Bimanual Exam:  Uterus:  uterus absent              Adnexa: no mass, fullness, tenderness               Rectovaginal: Confirms               Anus:  normal sphincter tone, no lesions  Chaperone present: No  A:  Well Woman with normal exam  Menopausal on  ERT 10/2002 - 03/2013 (only sporadic use in 2012 & 2013) Atrophic vaginitis uses Estring with good results   P:   Reviewed health and wellness pertinent to exam  Pap smear not taken today  Mammogram is due 04/2014  Refill Estring an coupon is given  IFOB is given at PCP  Counseled on breast self exam, mammography screening, use and side effects of HRT, adequate intake of calcium and vitamin D, diet and exercise return annually or prn  An After Visit Summary was printed and given to the patient.

## 2014-05-13 NOTE — Progress Notes (Signed)
Encounter reviewed by Dr. Brook Silva.  

## 2014-11-01 ENCOUNTER — Telehealth: Payer: Self-pay | Admitting: Certified Nurse Midwife

## 2014-11-01 NOTE — Telephone Encounter (Signed)
Patient has a question regarding her estring prescription.

## 2014-11-01 NOTE — Telephone Encounter (Signed)
Spoke with patient. Patient states that she no longer has insurance coverage and Estring will be $400. Patient states "I really need it for the hormonal benefits. I do not need it for the lubrication any more that is not a factor. It's mainly for my other symptoms like my hotflashes." Patient has been on Vivelle-Dot in the past. Advised will speak with provider regarding alternative medications and return call to patient. Patient is agreeable.  Routing to Verner Choleborah S. Leonard CNM for review as Lauro FranklinPatricia Rolen-Grubb, FNP is out of the office today.

## 2014-11-02 MED ORDER — ESTROGENS, CONJUGATED 0.625 MG/GM VA CREA
TOPICAL_CREAM | VAGINAL | Status: DC
Start: 2014-11-02 — End: 2016-05-29

## 2014-11-02 NOTE — Telephone Encounter (Signed)
Left message to call Kaitlyn at [phone]. 

## 2014-11-02 NOTE — Telephone Encounter (Signed)
Patient has been off Vivelle dot x 3 years per chart and epic. She can switch to Premarin cream which she will absorb as with Estring for atrophic vaginitis which is less expensive and we do have coupon also here for. I would not give Vivelle dot again.

## 2014-11-02 NOTE — Telephone Encounter (Signed)
Spoke with patient. Advised of message as seen below from Verner Choleborah S. Leonard CNM. Patient is agreeable. Would like to switch to Premarin to see if this will be cheaper for her. [facility]. Premarin cream apply 1/2 gram nightly for 2 weeks then 1/2 gram nightly two times per week #1 5RF sent to pharmacy of choice with savings card information. Patient will call back with any further questions.  Routing to provider for final review. Patient agreeable to disposition. Will close encounter.   Patient aware provider will review message and nurse will return call if any additional advice or change of disposition.

## 2015-05-14 ENCOUNTER — Encounter: Payer: Self-pay | Admitting: Certified Nurse Midwife

## 2015-05-14 ENCOUNTER — Ambulatory Visit (INDEPENDENT_AMBULATORY_CARE_PROVIDER_SITE_OTHER): Payer: BLUE CROSS/BLUE SHIELD | Admitting: Certified Nurse Midwife

## 2015-05-14 VITALS — BP 108/60 | HR 68 | Resp 14 | Ht 65.75 in | Wt 163.0 lb

## 2015-05-14 DIAGNOSIS — Z01419 Encounter for gynecological examination (general) (routine) without abnormal findings: Secondary | ICD-10-CM | POA: Diagnosis not present

## 2015-05-14 DIAGNOSIS — N952 Postmenopausal atrophic vaginitis: Secondary | ICD-10-CM | POA: Diagnosis not present

## 2015-05-14 NOTE — Progress Notes (Signed)
Patient ID: Caroline Huynh, female   DOB: [birth year], 55 y.o.   MRN: [id number] 55 y.o. [id number] Married  Caucasian Fe here for annual exam. Menopausal previously on Estring and now using Premarin Cream has stopped in the past month, without problems. Has also stopped Estring. Denies vaginal bleeding. Patient does not feel necessary and needs to decrease the expense. Social stress with spouse losing employment and now has retired at 79. Also has teen grandson living with them. Coping Ok at this point.  She has stable job and insurance. May down size house to renting again, does not want to do this if possible. Sees PCP for anxiety/hypothyroid medication management/labs and aex. All medications stable and labs normal per patient. No other health issues now.  Patient's last menstrual period was 04/27/2002.          Sexually active: Yes.    The current method of family planning is status post hysterectomy.    Exercising: Yes.    walking twice a week Smoker:  no  Health Maintenance: Pap:  06-29-02 WNL MMG:  06-14-14 WNL Colonoscopy:  04-29-10 WNL repeat in 10 yrs BMD:   2014 WNL per patient TDaP:  04-27-08 Shingles: no Pneumonia: no Hep C and HIV: no Labs: PCP does labs  Self Breast Exams: occasionally    reports that she has never smoked. She has never used smokeless tobacco. She reports that she does not drink alcohol or use illicit drugs.  Past Medical History  Diagnosis Date  . Hypothyroid   . Anxiety     Past Surgical History  Procedure Laterality Date  . Cholecystectomy  8/11  . Abdominal hysterectomy  2004     TAH ovaries retained  . Cesarean section      Current Outpatient Prescriptions  Medication Sig Dispense Refill  . Ascorbic Acid (VITAMIN C PO) Take by mouth daily.    Marland Kitchen dexlansoprazole (DEXILANT) 60 MG capsule Take 60 mg by mouth daily.    Marland Kitchen escitalopram (LEXAPRO) 10 MG tablet Take 10 mg by mouth daily.    . Glucosamine-Chondroitin-MSM (TRIPLE FLEX PO) Take by mouth daily.     Marland Kitchen levocetirizine (XYZAL) 5 MG tablet Take 5 mg by mouth as needed.     Marland Kitchen levothyroxine (SYNTHROID, LEVOTHROID) 75 MCG tablet Take 75 mcg by mouth daily before breakfast.    . Multiple Vitamins-Minerals (MULTIVITAMIN PO) Take by mouth daily.    . Omega-3 Fatty Acids (FISH OIL PO) Take by mouth daily.    Marland Kitchen conjugated estrogens (PREMARIN) vaginal cream Apply 1/2 gram vaginally nightly for two weeks. Then use 1/2 gram vaginally at night two times per week. (Patient not taking: Reported on 05/14/2015) 42.5 g 5   No current facility-administered medications for this visit.    Family History  Problem Relation Age of Onset  . Thyroid disease Mother   . Heart attack Mother   . Hypertension Mother   . Thyroid disease Sister   . Other Maternal Aunt     blood clotting issues  . Heart attack Maternal Grandfather   . Hypertension Maternal Grandfather     ROS:  Pertinent items are noted in HPI.  Otherwise, a comprehensive ROS was negative.  Exam:   BP 108/60 mmHg  Pulse 68  Resp 14  Ht 5' 5.75" (1.67 m)  Wt 163 lb (73.936 kg)  BMI 26.51 kg/m2  LMP 04/27/2002 Height: 5' 5.75" (167 cm) Ht Readings from Last 3 Encounters:  05/14/15 5' 5.75" (1.67 m)  05/10/14 5'  5.75" (1.67 m)  04/12/13 5' 5.75" (1.67 m)    General appearance: alert, cooperative and appears stated age Head: Normocephalic, without obvious abnormality, atraumatic Neck: no adenopathy, supple, symmetrical, trachea midline and thyroid normal to inspection and palpation Lungs: clear to auscultation bilaterally Breasts: normal appearance, no masses or tenderness, No nipple retraction or dimpling, No nipple discharge or bleeding, No axillary or supraclavicular adenopathy Heart: regular rate and rhythm Abdomen: soft, non-tender; no masses,  no organomegaly Extremities: extremities normal, atraumatic, no cyanosis or edema Skin: Skin color, texture, turgor normal. No rashes or lesions Lymph nodes: Cervical, supraclavicular, and  axillary nodes normal. No abnormal inguinal nodes palpated Neurologic: Grossly normal   Pelvic: External genitalia:  no lesions              Urethra:  normal appearing urethra with no masses, tenderness or lesions              Bartholin's and Skene's: normal                 Vagina: normal appearing vagina with normal color and discharge, no lesions              Cervix: absent              Pap taken: No. Bimanual Exam:  Uterus:  uterus absent              Adnexa: normal adnexa and no mass, fullness, tenderness               Rectovaginal: Confirms               Anus:  normal sphincter tone, no lesions  Chaperone present: yes  A:  Well Woman with normal exam  Menopausal no HRT, s/p TAH for bleeding, ovaries retained  Atrophic vaginitis  Social stress  Hypothyroid/Anxiety medication with PCP management  P:   Reviewed health and wellness pertinent to exam  Discussed vaginal findings and increase risk of UTI and vaginal infection and pain with intercourse. Discussed using coconut oil nightly with applicator and applying to external area and introitus. Patient would rather do this. Will advise if she has symptoms or feels no change, then may try Rx again.  Encouraged to seek support from family and friends.             Continue follow up with PCP as indicated  Pap smear as above not taken   counseled on breast self exam, mammography screening, menopause, adequate intake of calcium and vitamin D, diet and exercise  return annually or prn  An After Visit Summary was printed and given to the patient.

## 2015-05-14 NOTE — Patient Instructions (Signed)
EXERCISE AND DIET:  We recommended that you start or continue a regular exercise program for good health. Regular exercise means any activity that makes your heart beat faster and makes you sweat.  We recommend exercising at least 30 minutes per day at least 3 days a week, preferably 4 or 5.  We also recommend a diet low in fat and sugar.  Inactivity, poor dietary choices and obesity can cause diabetes, heart attack, stroke, and kidney damage, among others.    ALCOHOL AND SMOKING:  Women should limit their alcohol intake to no more than 7 drinks/beers/glasses of wine (combined, not each!) per week. Moderation of alcohol intake to this level decreases your risk of breast cancer and liver damage. And of course, no recreational drugs are part of a healthy lifestyle.  And absolutely no smoking or even second hand smoke. Most people know smoking can cause heart and lung diseases, but did you know it also contributes to weakening of your bones? Aging of your skin?  Yellowing of your teeth and nails?  CALCIUM AND VITAMIN D:  Adequate intake of calcium and Vitamin D are recommended.  The recommendations for exact amounts of these supplements seem to change often, but generally speaking 600 mg of calcium (either carbonate or citrate) and 800 units of Vitamin D per day seems prudent. Certain women may benefit from higher intake of Vitamin D.  If you are among these women, your doctor will have told you during your visit.    PAP SMEARS:  Pap smears, to check for cervical cancer or precancers,  have traditionally been done yearly, although recent scientific advances have shown that most women can have pap smears less often.  However, every woman still should have a physical exam from her gynecologist every year. It will include a breast check, inspection of the vulva and vagina to check for abnormal growths or skin changes, a visual exam of the cervix, and then an exam to evaluate the size and shape of the uterus and  ovaries.  And after 56 years of age, a rectal exam is indicated to check for rectal cancers. We will also provide age appropriate advice regarding health maintenance, like when you should have certain vaccines, screening for sexually transmitted diseases, bone density testing, colonoscopy, mammograms, etc.   MAMMOGRAMS:  All women over 40 years old should have a yearly mammogram. Many facilities now offer a "3D" mammogram, which may cost around $50 extra out of pocket. If possible,  we recommend you accept the option to have the 3D mammogram performed.  It both reduces the number of women who will be called back for extra views which then turn out to be normal, and it is better than the routine mammogram at detecting truly abnormal areas.    COLONOSCOPY:  Colonoscopy to screen for colon cancer is recommended for all women at age 50.  We know, you hate the idea of the prep.  We agree, BUT, having colon cancer and not knowing it is worse!!  Colon cancer so often starts as a polyp that can be seen and removed at colonscopy, which can quite literally save your life!  And if your first colonoscopy is normal and you have no family history of colon cancer, most women don't have to have it again for 10 years.  Once every ten years, you can do something that may end up saving your life, right?  We will be happy to help you get it scheduled when you are ready.    Be sure to check your insurance coverage so you understand how much it will cost.  It may be covered as a preventative service at no cost, but you should check your particular policy.      Atrophic Vaginitis Atrophic vaginitis is when the tissues that line the vagina become dry and thin. This is caused by a drop in estrogen. Estrogen helps:  To keep the vagina moist.  To make a clear fluid that helps:  To lubricate the vagina for sex.  To protect the vagina from infection. If the lining of the vagina is dry and thin, it may:  Make sex painful. It  may also cause bleeding.  Cause a feeling of:  Burning.  Irritation.  Itchiness.  Make an exam of your vagina painful. It may also cause bleeding.  Make you lose interest in sex.  Cause a burning feeling when you pee.  Make your vaginal fluid (discharge) brown or yellow. For some women, there are no symptoms. This condition is most common in women who do not get their regular menstrual periods anymore (menopause). This often starts when a woman is 45-55 years old. HOME CARE  Take medicines only as told by your doctor. Do not use any herbal or alternative medicines unless your doctor says it is okay.  Use over-the-counter products for dryness only as told by your doctor. These include:  Creams.  Lubricants.  Moisturizers.  Do not douche.  Do not use products that can make your vagina dry. These include:  Scented feminine sprays.  Scented tampons.  Scented soaps.  If it hurts to have sex, tell your sexual partner. GET HELP IF:  Your discharge looks different than normal.  Your vagina has an unusual smell.  You have new symptoms.  Your symptoms do not get better with treatment.  Your symptoms get worse.   This information is not intended to replace advice given to you by your health care provider. Make sure you discuss any questions you have with your health care provider.   Document Released: 09/30/2007 Document Revised: 08/28/2014 Document Reviewed: 04/04/2014 Elsevier Interactive Patient Education 2016 [employer]  

## 2015-05-20 NOTE — Progress Notes (Signed)
Reviewed personally.  M. Suzanne Nakeesha Bowler, MD.  

## 2015-12-20 DIAGNOSIS — H2513 Age-related nuclear cataract, bilateral: Secondary | ICD-10-CM | POA: Diagnosis not present

## 2016-03-03 DIAGNOSIS — Z23 Encounter for immunization: Secondary | ICD-10-CM | POA: Diagnosis not present

## 2016-05-14 ENCOUNTER — Ambulatory Visit: Payer: BLUE CROSS/BLUE SHIELD | Admitting: Certified Nurse Midwife

## 2016-05-29 ENCOUNTER — Ambulatory Visit (INDEPENDENT_AMBULATORY_CARE_PROVIDER_SITE_OTHER): Payer: BLUE CROSS/BLUE SHIELD | Admitting: Certified Nurse Midwife

## 2016-05-29 ENCOUNTER — Encounter: Payer: Self-pay | Admitting: Certified Nurse Midwife

## 2016-05-29 VITALS — BP 110/62 | HR 68 | Resp 16 | Ht 65.75 in | Wt 166.0 lb

## 2016-05-29 DIAGNOSIS — Z01419 Encounter for gynecological examination (general) (routine) without abnormal findings: Secondary | ICD-10-CM | POA: Diagnosis not present

## 2016-05-29 DIAGNOSIS — N951 Menopausal and female climacteric states: Secondary | ICD-10-CM | POA: Diagnosis not present

## 2016-05-29 NOTE — Progress Notes (Signed)
56 y.o. [id number] Married  Caucasian Fe here for annual exam. Menopausal no HRT. Denies vaginal bleeding/or vaginal dryness. Social stress with spouse losing his employment and also had to move due to income. Has had multiple problems with new home, but surviving. Spouse health issues improving some now. Good social support with church family and job. Sees PCP for aex, labs, hyothyroid/anxiety management, all stable. No other health issues today. Plans a vacation this year!  Patient's last menstrual period was 04/27/2002.          Sexually active: No.  The current method of family planning is status post hysterectomy.    Exercising: Yes.    walking Smoker:  no  Health Maintenance: Pap:  06-29-02 wnl MMG:  05-17-15 category b density birads 1:neg Colonoscopy:  2012 f/u 31yrs BMD:   2014 normal per patient TDaP:  2010 Shingles: no Pneumonia: no Hep C and HIV: not done Labs: pcp Self breast exam: done occ   reports that she has never smoked. She has never used smokeless tobacco. She reports that she drinks alcohol. She reports that she does not use drugs.  Past Medical History:  Diagnosis Date  . Anxiety   . Hypothyroid     Past Surgical History:  Procedure Laterality Date  . ABDOMINAL HYSTERECTOMY  2004    TAH ovaries retained  . CESAREAN SECTION    . CHOLECYSTECTOMY  8/11    Current Outpatient Prescriptions  Medication Sig Dispense Refill  . Ascorbic Acid (VITAMIN C PO) Take by mouth daily.    Marland Kitchen dexlansoprazole (DEXILANT) 60 MG capsule Take 60 mg by mouth daily.    Marland Kitchen escitalopram (LEXAPRO) 10 MG tablet Take 10 mg by mouth daily.    . Glucosamine-Chondroitin-MSM (TRIPLE FLEX PO) Take by mouth daily.    Marland Kitchen levocetirizine (XYZAL) 5 MG tablet Take 5 mg by mouth as needed.     Marland Kitchen levothyroxine (SYNTHROID, LEVOTHROID) 75 MCG tablet Take 75 mcg by mouth daily before breakfast.    . Multiple Vitamins-Minerals (MULTIVITAMIN PO) Take by mouth daily.    . Omega-3 Fatty Acids (FISH OIL PO)  Take by mouth daily.     No current facility-administered medications for this visit.     Family History  Problem Relation Age of Onset  . Thyroid disease Mother   . Heart attack Mother   . Hypertension Mother   . Thyroid disease Sister   . Other Maternal Aunt     blood clotting issues  . Heart attack Maternal Grandfather   . Hypertension Maternal Grandfather     ROS:  Pertinent items are noted in HPI.  Otherwise, a comprehensive ROS was negative.  Exam:   BP 110/62   Pulse 68   Resp 16   Ht 5' 5.75" (1.67 m)   Wt 166 lb (75.3 kg)   LMP 04/27/2002   BMI 27.00 kg/m  Height: 5' 5.75" (167 cm) Ht Readings from Last 3 Encounters:  05/29/16 5' 5.75" (1.67 m)  05/14/15 5' 5.75" (1.67 m)  05/10/14 5' 5.75" (1.67 m)    General appearance: alert, cooperative and appears stated age Head: Normocephalic, without obvious abnormality, atraumatic Neck: no adenopathy, supple, symmetrical, trachea midline and thyroid normal to inspection and palpation Lungs: clear to auscultation bilaterally Breasts: normal appearance, no masses or tenderness, No nipple retraction or dimpling, No nipple discharge or bleeding, No axillary or supraclavicular adenopathy Heart: regular rate and rhythm Abdomen: soft, non-tender; no masses,  no organomegaly Extremities: extremities normal, atraumatic, no cyanosis  or edema Skin: Skin color, texture, turgor normal. No rashes or lesions Lymph nodes: Cervical, supraclavicular, and axillary nodes normal. No abnormal inguinal nodes palpated Neurologic: Grossly normal   Pelvic: External genitalia:  no lesions              Urethra:  normal appearing urethra with no masses, tenderness or lesions              Bartholin's and Skene's: normal                 Vagina: normal appearing vagina with normal color and discharge, no lesions              Cervix: absent              Pap taken: No. Bimanual Exam:  Uterus:  uterus absent              Adnexa: normal adnexa  and no mass, fullness, tenderness               Rectovaginal: Confirms               Anus:  normal sphincter tone, no lesions  Chaperone present: yes  A:  Well Woman with normal exam  Post menopausal no HRT s/p TAH for fibroids ovaries retained  Hypothyroid/anxiety management with PCP  Social stress with spouse job loss and move to a smaller house  P:   Reviewed health and wellness pertinent to exam   Aware of need to evaluate if vaginal bleeding  Continue follow up with PCP as indicated  Continue to seek support from family and friends and give herself time to adjust  Pap smear as above not taken  counseled on breast self exam, mammography screening, adequate intake of calcium and vitamin D, diet and exercise  return annually or prn  An After Visit Summary was printed and given to the patient.

## 2016-05-29 NOTE — Patient Instructions (Signed)

## 2016-05-31 NOTE — Progress Notes (Signed)
Encounter reviewed Emmery Seiler, MD   

## 2016-06-05 DIAGNOSIS — Z1231 Encounter for screening mammogram for malignant neoplasm of breast: Secondary | ICD-10-CM | POA: Diagnosis not present

## 2017-03-05 DIAGNOSIS — Z23 Encounter for immunization: Secondary | ICD-10-CM | POA: Diagnosis not present

## 2017-06-04 ENCOUNTER — Encounter: Payer: Self-pay | Admitting: Certified Nurse Midwife

## 2017-06-04 ENCOUNTER — Other Ambulatory Visit: Payer: Self-pay

## 2017-06-04 ENCOUNTER — Ambulatory Visit (INDEPENDENT_AMBULATORY_CARE_PROVIDER_SITE_OTHER): Payer: BLUE CROSS/BLUE SHIELD | Admitting: Certified Nurse Midwife

## 2017-06-04 VITALS — BP 120/64 | HR 68 | Resp 16 | Ht 65.5 in | Wt 168.0 lb

## 2017-06-04 DIAGNOSIS — Z01419 Encounter for gynecological examination (general) (routine) without abnormal findings: Secondary | ICD-10-CM | POA: Diagnosis not present

## 2017-06-04 NOTE — Progress Notes (Signed)
57 y.o. [id number] Married  Caucasian Fe here for annual exam. Menopausal no HRT. Denies vaginal bleeding or vaginal dryness. Occasional hot flash, but no issues. Sees Dr. Felipa EthAvva for aex, labs, Hypothyroid,anxiety medication management. and BMD due for exam in 4/19. Staying busy. No other issues today.  Patient's last menstrual period was 04/27/2002.          Sexually active: No.  The current method of family planning is status post hysterectomy.    Exercising: Yes.    walking Smoker:  no  Health Maintenance: Pap:  06-29-02 neg History of Abnormal Pap: no MMG:  06-05-16 category b density birads 1:neg scheduled Self Breast exams: occ Colonoscopy:  2012 f/u 6050yrs BMD:   2014 patient will talk with PCP about TDaP:  2010 Shingles: no Pneumonia: no Hep C and HIV: not done Labs: with PCP   reports that  has never smoked. she has never used smokeless tobacco. She reports that she drinks alcohol. She reports that she does not use drugs.  Past Medical History:  Diagnosis Date  . Anxiety   . Hypothyroid     Past Surgical History:  Procedure Laterality Date  . ABDOMINAL HYSTERECTOMY  2004    TAH ovaries retained  . CESAREAN SECTION    . CHOLECYSTECTOMY  8/11    Current Outpatient Medications  Medication Sig Dispense Refill  . Ascorbic Acid (VITAMIN C PO) Take by mouth daily.    . Calcium Citrate-Vitamin D (CALCIUM + D PO) Take by mouth.    . dexlansoprazole (DEXILANT) 60 MG capsule Take 60 mg by mouth daily.    Marland Kitchen. escitalopram (LEXAPRO) 10 MG tablet Take 10 mg by mouth daily.    . Glucosamine-Chondroitin-MSM (TRIPLE FLEX PO) Take by mouth daily.    Marland Kitchen. levocetirizine (XYZAL) 5 MG tablet Take 5 mg by mouth as needed.     Marland Kitchen. levothyroxine (SYNTHROID, LEVOTHROID) 75 MCG tablet Take 75 mcg by mouth daily before breakfast.    . Multiple Vitamins-Minerals (MULTIVITAMIN PO) Take by mouth daily.    . Omega-3 Fatty Acids (FISH OIL PO) Take by mouth daily.     No current facility-administered  medications for this visit.     Family History  Problem Relation Age of Onset  . Thyroid disease Mother   . Heart attack Mother   . Hypertension Mother   . Thyroid disease Sister   . Other Maternal Aunt        blood clotting issues  . Heart attack Maternal Grandfather   . Hypertension Maternal Grandfather     ROS:  Pertinent items are noted in HPI.  Otherwise, a comprehensive ROS was negative.  Exam:   BP 120/64   Pulse 68   Resp 16   Ht 5' 5.5" (1.664 m)   Wt 168 lb (76.2 kg)   LMP 04/27/2002   BMI 27.53 kg/m  Height: 5' 5.5" (166.4 cm) Ht Readings from Last 3 Encounters:  06/04/17 5' 5.5" (1.664 m)  05/29/16 5' 5.75" (1.67 m)  05/14/15 5' 5.75" (1.67 m)    General appearance: alert, cooperative and appears stated age Head: Normocephalic, without obvious abnormality, atraumatic Neck: no adenopathy, supple, symmetrical, trachea midline and thyroid normal to inspection and palpation Lungs: clear to auscultation bilaterally Breasts: normal appearance, no masses or tenderness, No nipple retraction or dimpling, No nipple discharge or bleeding, No axillary or supraclavicular adenopathy Heart: regular rate and rhythm Abdomen: soft, non-tender; no masses,  no organomegaly Extremities: extremities normal, atraumatic, no cyanosis or edema  Skin: Skin color, texture, turgor normal. No rashes or lesions Lymph nodes: Cervical, supraclavicular, and axillary nodes normal. No abnormal inguinal nodes palpated Neurologic: Grossly normal   Pelvic: External genitalia:  no lesions              Urethra:  normal appearing urethra with no masses, tenderness or lesions              Bartholin's and Skene's: normal                 Vagina: normal appearing vagina with normal color and discharge, no lesions              Cervix: absent              Pap taken: No. Bimanual Exam:  Uterus:  uterus absent              Adnexa: normal adnexa and no mass, fullness, tenderness                Rectovaginal: Confirms               Anus:  normal sphincter tone, no lesions  Chaperone present: yes  A:  Well Woman with normal exam  Menopausal  No HRT, s/p TAH for bleeding, ovaries retained  Hypothyroid/anxiety with PCP management  Osteopenia history with PCP management, BMD due  P:   Reviewed health and wellness pertinent to exam  Aware of need to advise if vaginal dryness or bleeding occurs.  Continue follow up with PCP as indicated at check on BMD  Pap smear: no   counseled on breast self exam, mammography screening, menopause, adequate intake of calcium and vitamin D, diet and exercise, Kegel's exercises  return annually or prn  An After Visit Summary was printed and given to the patient.

## 2017-06-04 NOTE — Patient Instructions (Signed)

## 2017-06-15 DIAGNOSIS — Z1231 Encounter for screening mammogram for malignant neoplasm of breast: Secondary | ICD-10-CM | POA: Diagnosis not present

## 2017-07-20 DIAGNOSIS — Z0000 Encounter for general adult medical examination without abnormal findings: Secondary | ICD-10-CM | POA: Diagnosis not present

## 2017-07-20 DIAGNOSIS — E039 Hypothyroidism, unspecified: Secondary | ICD-10-CM | POA: Diagnosis not present

## 2017-07-20 DIAGNOSIS — M8580 Other specified disorders of bone density and structure, unspecified site: Secondary | ICD-10-CM | POA: Diagnosis not present

## 2017-07-27 DIAGNOSIS — Z1339 Encounter for screening examination for other mental health and behavioral disorders: Secondary | ICD-10-CM | POA: Diagnosis not present

## 2017-07-27 DIAGNOSIS — E7849 Other hyperlipidemia: Secondary | ICD-10-CM | POA: Diagnosis not present

## 2017-07-27 DIAGNOSIS — Z0000 Encounter for general adult medical examination without abnormal findings: Secondary | ICD-10-CM | POA: Diagnosis not present

## 2017-07-27 DIAGNOSIS — Z1331 Encounter for screening for depression: Secondary | ICD-10-CM | POA: Diagnosis not present

## 2017-07-27 DIAGNOSIS — E038 Other specified hypothyroidism: Secondary | ICD-10-CM | POA: Diagnosis not present

## 2017-07-27 DIAGNOSIS — M859 Disorder of bone density and structure, unspecified: Secondary | ICD-10-CM | POA: Diagnosis not present

## 2017-07-27 DIAGNOSIS — K219 Gastro-esophageal reflux disease without esophagitis: Secondary | ICD-10-CM | POA: Diagnosis not present

## 2017-07-29 DIAGNOSIS — M859 Disorder of bone density and structure, unspecified: Secondary | ICD-10-CM | POA: Diagnosis not present

## 2017-07-30 DIAGNOSIS — Z1212 Encounter for screening for malignant neoplasm of rectum: Secondary | ICD-10-CM | POA: Diagnosis not present

## 2017-08-27 DIAGNOSIS — M25462 Effusion, left knee: Secondary | ICD-10-CM | POA: Diagnosis not present

## 2017-08-27 DIAGNOSIS — Z6827 Body mass index (BMI) 27.0-27.9, adult: Secondary | ICD-10-CM | POA: Diagnosis not present

## 2018-01-05 DIAGNOSIS — Z23 Encounter for immunization: Secondary | ICD-10-CM | POA: Diagnosis not present

## 2018-06-17 ENCOUNTER — Ambulatory Visit: Payer: BLUE CROSS/BLUE SHIELD | Admitting: Certified Nurse Midwife

## 2018-06-21 ENCOUNTER — Encounter: Payer: Self-pay | Admitting: Certified Nurse Midwife

## 2018-06-21 DIAGNOSIS — Z1231 Encounter for screening mammogram for malignant neoplasm of breast: Secondary | ICD-10-CM | POA: Diagnosis not present

## 2018-07-15 ENCOUNTER — Ambulatory Visit: Payer: BLUE CROSS/BLUE SHIELD | Admitting: Certified Nurse Midwife

## 2018-08-08 DIAGNOSIS — M859 Disorder of bone density and structure, unspecified: Secondary | ICD-10-CM | POA: Diagnosis not present

## 2018-08-08 DIAGNOSIS — R82998 Other abnormal findings in urine: Secondary | ICD-10-CM | POA: Diagnosis not present

## 2018-08-08 DIAGNOSIS — E038 Other specified hypothyroidism: Secondary | ICD-10-CM | POA: Diagnosis not present

## 2018-08-08 DIAGNOSIS — E7849 Other hyperlipidemia: Secondary | ICD-10-CM | POA: Diagnosis not present

## 2018-08-11 DIAGNOSIS — E039 Hypothyroidism, unspecified: Secondary | ICD-10-CM | POA: Diagnosis not present

## 2018-08-11 DIAGNOSIS — K219 Gastro-esophageal reflux disease without esophagitis: Secondary | ICD-10-CM | POA: Diagnosis not present

## 2018-08-11 DIAGNOSIS — Z0000 Encounter for general adult medical examination without abnormal findings: Secondary | ICD-10-CM | POA: Diagnosis not present

## 2018-08-11 DIAGNOSIS — M8580 Other specified disorders of bone density and structure, unspecified site: Secondary | ICD-10-CM | POA: Diagnosis not present

## 2018-08-11 DIAGNOSIS — E785 Hyperlipidemia, unspecified: Secondary | ICD-10-CM | POA: Diagnosis not present

## 2018-08-11 DIAGNOSIS — Z1331 Encounter for screening for depression: Secondary | ICD-10-CM | POA: Diagnosis not present

## 2019-02-02 DIAGNOSIS — Z23 Encounter for immunization: Secondary | ICD-10-CM | POA: Diagnosis not present

## 2019-07-17 ENCOUNTER — Encounter: Payer: Self-pay | Admitting: Certified Nurse Midwife

## 2020-11-17 ENCOUNTER — Encounter: Payer: Self-pay | Admitting: Gastroenterology

## 2023-05-11 DIAGNOSIS — L729 Follicular cyst of the skin and subcutaneous tissue, unspecified: Secondary | ICD-10-CM | POA: Diagnosis not present

## 2023-05-11 DIAGNOSIS — M79671 Pain in right foot: Secondary | ICD-10-CM | POA: Diagnosis not present

## 2023-05-24 ENCOUNTER — Encounter: Payer: Self-pay | Admitting: Podiatry

## 2023-05-24 ENCOUNTER — Ambulatory Visit (INDEPENDENT_AMBULATORY_CARE_PROVIDER_SITE_OTHER): Payer: 59 | Admitting: Podiatry

## 2023-05-24 DIAGNOSIS — Q828 Other specified congenital malformations of skin: Secondary | ICD-10-CM

## 2023-05-24 DIAGNOSIS — B070 Plantar wart: Secondary | ICD-10-CM | POA: Diagnosis not present

## 2023-05-24 NOTE — Progress Notes (Signed)
Subjective:   Patient ID: Caroline Huynh, female   DOB: 63 y.o.   MRN: [id number]   HPI Patient states over the last month she developed a lesion on the bottom of her heel with history of having had this previously and states that it gets sore.  Patient does not smoke likes to be active   Review of Systems  All other systems reviewed and are negative.       Objective:  Physical Exam Vitals and nursing note reviewed.  Constitutional:      Appearance: She is well-developed.  Pulmonary:     Effort: Pulmonary effort is normal.  Musculoskeletal:        General: Normal range of motion.  Skin:    General: Skin is warm.  Neurological:     Mental Status: She is alert.     Neurovascular status intact muscle strength adequate range of motion within normal limits with patient found to have keratotic lesion subright heel upon debridement shows pinpoint bleeding pain to lateral pressure localized to this area with good digital perfusion     Assessment:  Probability for verruca plantaris plantar right cannot rule out porokeratotic or foreign body     Plan:  Age be reviewed sterile sharp debridement of the area accomplished and applied chemical agent to create immune response sterile dressing that I want her to wait 48 hours and gave padding to wear around the area.  Explained what to do if blistering were to occur reappoint as needed

## 2023-06-24 ENCOUNTER — Ambulatory Visit (INDEPENDENT_AMBULATORY_CARE_PROVIDER_SITE_OTHER): Payer: 59 | Admitting: Podiatry

## 2023-06-24 ENCOUNTER — Encounter: Payer: Self-pay | Admitting: Podiatry

## 2023-06-24 VITALS — Ht 65.5 in | Wt 168.0 lb

## 2023-06-24 DIAGNOSIS — B070 Plantar wart: Secondary | ICD-10-CM

## 2023-06-24 NOTE — Patient Instructions (Signed)

## 2023-06-24 NOTE — Progress Notes (Signed)
 Subjective:   Patient ID: Caroline Huynh, female   DOB: 63 y.o.   MRN: [id number]   HPI Patient states that she still is getting a lot of pain in her right heel and states that the lesion did not respond and it very sore on the bottom of the foot.   ROS      Objective:  Physical Exam  Neurovascular status intact with lesion plantar right measuring approximately 1.5 cm x 1.2 cm that upon debridement shows pinpoint bleeding pain to lateral pressure     Assessment:  Severe lesion plantar right painful when pressed     Plan:  H&P reviewed recommended excision of the mass and explained procedure risk and we will send the pathology.  I anesthetized the area with 120 mg Xylocaine with epi Marcaine and I then went ahead using sterile instrumentation I circumscribed and excised the mass entirely.  I did place a small amount of phenol on the base in order to kill remaining cells applied sterile dressing instructed on elevation and I did send it off for pathology.  The mass was approximately 1.5 x 1.2 cm

## 2023-06-29 LAB — TISSUE SPECIMEN

## 2023-06-29 LAB — PATHOLOGY REPORT

## 2023-10-13 DIAGNOSIS — E039 Hypothyroidism, unspecified: Secondary | ICD-10-CM | POA: Diagnosis not present

## 2023-10-13 DIAGNOSIS — M810 Age-related osteoporosis without current pathological fracture: Secondary | ICD-10-CM | POA: Diagnosis not present

## 2023-10-13 DIAGNOSIS — Z1212 Encounter for screening for malignant neoplasm of rectum: Secondary | ICD-10-CM | POA: Diagnosis not present

## 2023-10-13 DIAGNOSIS — E559 Vitamin D deficiency, unspecified: Secondary | ICD-10-CM | POA: Diagnosis not present

## 2023-10-13 DIAGNOSIS — E785 Hyperlipidemia, unspecified: Secondary | ICD-10-CM | POA: Diagnosis not present

## 2023-10-20 DIAGNOSIS — F39 Unspecified mood [affective] disorder: Secondary | ICD-10-CM | POA: Diagnosis not present

## 2023-10-20 DIAGNOSIS — Z0000 Encounter for general adult medical examination without abnormal findings: Secondary | ICD-10-CM | POA: Diagnosis not present

## 2023-10-20 DIAGNOSIS — M810 Age-related osteoporosis without current pathological fracture: Secondary | ICD-10-CM | POA: Diagnosis not present

## 2023-10-20 DIAGNOSIS — E039 Hypothyroidism, unspecified: Secondary | ICD-10-CM | POA: Diagnosis not present

## 2023-10-20 DIAGNOSIS — E785 Hyperlipidemia, unspecified: Secondary | ICD-10-CM | POA: Diagnosis not present

## 2023-10-20 DIAGNOSIS — J302 Other seasonal allergic rhinitis: Secondary | ICD-10-CM | POA: Diagnosis not present

## 2023-10-20 DIAGNOSIS — N3281 Overactive bladder: Secondary | ICD-10-CM | POA: Diagnosis not present

## 2023-10-20 DIAGNOSIS — Z1339 Encounter for screening examination for other mental health and behavioral disorders: Secondary | ICD-10-CM | POA: Diagnosis not present

## 2023-10-20 DIAGNOSIS — R82998 Other abnormal findings in urine: Secondary | ICD-10-CM | POA: Diagnosis not present

## 2023-10-20 DIAGNOSIS — Z1211 Encounter for screening for malignant neoplasm of colon: Secondary | ICD-10-CM | POA: Diagnosis not present

## 2023-10-20 DIAGNOSIS — K219 Gastro-esophageal reflux disease without esophagitis: Secondary | ICD-10-CM | POA: Diagnosis not present

## 2023-10-20 DIAGNOSIS — Z1331 Encounter for screening for depression: Secondary | ICD-10-CM | POA: Diagnosis not present

## 2023-10-20 DIAGNOSIS — E559 Vitamin D deficiency, unspecified: Secondary | ICD-10-CM | POA: Diagnosis not present

## 2023-12-20 DIAGNOSIS — Z1231 Encounter for screening mammogram for malignant neoplasm of breast: Secondary | ICD-10-CM | POA: Diagnosis not present

## 2024-01-03 DIAGNOSIS — E039 Hypothyroidism, unspecified: Secondary | ICD-10-CM | POA: Diagnosis not present
